# Patient Record
Sex: Female | Born: 1990 | Race: White | Marital: Single | Smoking: Never smoker
Health system: Southern US, Community
[De-identification: ages and names within clinical notes are randomized; demographics above are authoritative.]

## PROBLEM LIST (undated history)

## (undated) HISTORY — PX: TONSILLECTOMY: SUR1361

## (undated) HISTORY — PX: EYE SURGERY: SHX253

---
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
## 2013-07-03 ENCOUNTER — Encounter (HOSPITAL_COMMUNITY): Payer: Self-pay | Admitting: Emergency Medicine

## 2013-07-03 ENCOUNTER — Emergency Department (HOSPITAL_COMMUNITY)
Admission: EM | Admit: 2013-07-03 | Discharge: 2013-07-04 | Disposition: A | Discharge status: Home / Self Care | Payer: BC Managed Care – PPO | Attending: Emergency Medicine | Admitting: Emergency Medicine

## 2013-07-03 DIAGNOSIS — Z3202 Encounter for pregnancy test, result negative: Secondary | ICD-10-CM | POA: Insufficient documentation

## 2013-07-03 DIAGNOSIS — R109 Unspecified abdominal pain: Secondary | ICD-10-CM

## 2013-07-03 DIAGNOSIS — R1012 Left upper quadrant pain: Secondary | ICD-10-CM | POA: Insufficient documentation

## 2013-07-03 DIAGNOSIS — R1013 Epigastric pain: Secondary | ICD-10-CM | POA: Insufficient documentation

## 2013-07-03 LAB — BASIC METABOLIC PANEL
BUN: 17 mg/dL (ref 6–23)
CALCIUM: 9.6 mg/dL (ref 8.4–10.5)
CO2: 28 mEq/L (ref 19–32)
Chloride: 103 mEq/L (ref 96–112)
Creatinine, Ser: 1.07 mg/dL (ref 0.50–1.10)
GFR calc Af Amer: 86 mL/min — ABNORMAL LOW (ref >90)
GFR, EST NON AFRICAN AMERICAN: 74 mL/min — AB (ref >90)
GLUCOSE: 97 mg/dL (ref 70–99)
Potassium: 4.1 mEq/L (ref 3.7–5.3)
SODIUM: 142 meq/L (ref 137–147)

## 2013-07-03 LAB — CBC WITH DIFFERENTIAL/PLATELET
Basophils Absolute: 0 10*3/uL (ref 0.0–0.1)
Basophils Relative: 0 % (ref 0–1)
EOS ABS: 0.6 10*3/uL (ref 0.0–0.7)
EOS PCT: 10 % — AB (ref 0–5)
HCT: 39.4 % (ref 36.0–46.0)
Hemoglobin: 13.1 g/dL (ref 12.0–15.0)
LYMPHS ABS: 2.1 10*3/uL (ref 0.7–4.0)
Lymphocytes Relative: 36 % (ref 12–46)
MCH: 30.8 pg (ref 26.0–34.0)
MCHC: 33.2 g/dL (ref 30.0–36.0)
MCV: 92.7 fL (ref 78.0–100.0)
Monocytes Absolute: 0.4 10*3/uL (ref 0.1–1.0)
Monocytes Relative: 7 % (ref 3–12)
Neutro Abs: 2.7 10*3/uL (ref 1.7–7.7)
Neutrophils Relative %: 47 % (ref 43–77)
PLATELETS: 209 10*3/uL (ref 150–400)
RBC: 4.25 MIL/uL (ref 3.87–5.11)
RDW: 12.9 % (ref 11.5–15.5)
WBC: 5.7 10*3/uL (ref 4.0–10.5)

## 2013-07-03 LAB — URINALYSIS, ROUTINE W REFLEX MICROSCOPIC
BILIRUBIN URINE: NEGATIVE
GLUCOSE, UA: NEGATIVE mg/dL
HGB URINE DIPSTICK: NEGATIVE
KETONES UR: NEGATIVE mg/dL
LEUKOCYTES UA: NEGATIVE
Nitrite: NEGATIVE
PROTEIN: NEGATIVE mg/dL
Specific Gravity, Urine: 1.025 (ref 1.005–1.030)
Urobilinogen, UA: 0.2 mg/dL (ref 0.0–1.0)
pH: 6 (ref 5.0–8.0)

## 2013-07-03 LAB — PREGNANCY, URINE: Preg Test, Ur: NEGATIVE

## 2013-07-03 MED ORDER — FAMOTIDINE IN NACL 20-0.9 MG/50ML-% IV SOLN
20.0000 mg | Freq: Once | INTRAVENOUS | Status: AC
  Administered 2013-07-03: 20 mg via INTRAVENOUS
  Filled 2013-07-03: qty 50

## 2013-07-03 MED ORDER — HYDROCODONE-ACETAMINOPHEN 5-325 MG PO TABS
1.0000 | ORAL_TABLET | Freq: Once | ORAL | Status: AC
  Administered 2013-07-04: 1 via ORAL
  Filled 2013-07-03: qty 1

## 2013-07-03 NOTE — ED Notes (Signed)
Pt a+ox4, presents with c/o L sided abd pain x4 days, intermittent previously but constant today.  Pt reports pain radiates into mid back.  7/10.  Pt denies fevers/chills, n/v/d/c, denies dysuria.  Reports eating/drinking normally.  Skin pwd.  MAEI.  Ambulating with steady gait.

## 2013-07-04 ENCOUNTER — Ambulatory Visit: Payer: BC Managed Care – PPO

## 2013-07-04 ENCOUNTER — Ambulatory Visit (INDEPENDENT_AMBULATORY_CARE_PROVIDER_SITE_OTHER): Payer: BC Managed Care – PPO | Admitting: Internal Medicine

## 2013-07-04 ENCOUNTER — Emergency Department (HOSPITAL_COMMUNITY): Payer: BC Managed Care – PPO

## 2013-07-04 VITALS — BP 102/64 | HR 55 | Temp 97.5°F | Resp 16 | Ht 66.5 in | Wt 166.4 lb

## 2013-07-04 DIAGNOSIS — R1084 Generalized abdominal pain: Secondary | ICD-10-CM

## 2013-07-04 DIAGNOSIS — R1011 Right upper quadrant pain: Secondary | ICD-10-CM

## 2013-07-04 DIAGNOSIS — K5900 Constipation, unspecified: Secondary | ICD-10-CM

## 2013-07-04 LAB — POCT CBC
Granulocyte percent: 68.5 %G (ref 37–80)
HEMATOCRIT: 41 % (ref 37.7–47.9)
HEMOGLOBIN: 12.8 g/dL (ref 12.2–16.2)
Lymph, poc: 1.5 (ref 0.6–3.4)
MCH, POC: 30.6 pg (ref 27–31.2)
MCHC: 31.2 g/dL — AB (ref 31.8–35.4)
MCV: 98.1 fL — AB (ref 80–97)
MID (cbc): 0.5 (ref 0–0.9)
MPV: 8.8 fL (ref 0–99.8)
POC GRANULOCYTE: 4.3 (ref 2–6.9)
POC LYMPH PERCENT: 23.8 %L (ref 10–50)
POC MID %: 7.7 % (ref 0–12)
Platelet Count, POC: 182 10*3/uL (ref 142–424)
RBC: 4.18 M/uL (ref 4.04–5.48)
RDW, POC: 13.9 %
WBC: 6.3 10*3/uL (ref 4.6–10.2)

## 2013-07-04 LAB — COMPLETE METABOLIC PANEL WITH GFR
ALBUMIN: 4.3 g/dL (ref 3.5–5.2)
ALT: 10 U/L (ref 0–35)
AST: 15 U/L (ref 0–37)
Alkaline Phosphatase: 37 U/L — ABNORMAL LOW (ref 39–117)
BUN: 12 mg/dL (ref 6–23)
CO2: 27 meq/L (ref 19–32)
Calcium: 8.8 mg/dL (ref 8.4–10.5)
Chloride: 104 mEq/L (ref 96–112)
Creat: 0.89 mg/dL (ref 0.50–1.10)
GFR, Est Non African American: >89 mL/min
Glucose, Bld: 90 mg/dL (ref 70–99)
POTASSIUM: 4.5 meq/L (ref 3.5–5.3)
Sodium: 137 mEq/L (ref 135–145)
Total Bilirubin: 0.4 mg/dL (ref 0.2–1.2)
Total Protein: 6.5 g/dL (ref 6.0–8.3)

## 2013-07-04 LAB — HEPATIC FUNCTION PANEL
ALBUMIN: 4.1 g/dL (ref 3.5–5.2)
ALT: 11 U/L (ref 0–35)
AST: 20 U/L (ref 0–37)
Alkaline Phosphatase: 52 U/L (ref 39–117)
Bilirubin, Direct: <0.2 mg/dL (ref 0.0–0.3)
TOTAL PROTEIN: 7.3 g/dL (ref 6.0–8.3)
Total Bilirubin: 0.3 mg/dL (ref 0.3–1.2)

## 2013-07-04 LAB — LIPASE, BLOOD: Lipase: 32 U/L (ref 11–59)

## 2013-07-04 MED ORDER — POLYETHYLENE GLYCOL 3350 17 GM/SCOOP PO POWD: 17.0000 g | Freq: Two times a day (BID) | ORAL | Status: DC | PRN

## 2013-07-04 MED ORDER — OMEPRAZOLE 20 MG PO CPDR: 20.0000 mg | DELAYED_RELEASE_CAPSULE | Freq: Every day | ORAL | Status: DC

## 2013-07-04 MED ORDER — IBUPROFEN 600 MG PO TABS: 600.0000 mg | ORAL_TABLET | Freq: Four times a day (QID) | ORAL | Status: DC | PRN

## 2013-07-04 NOTE — Progress Notes (Signed)
Subjective:    Patient ID: Natalie Davidson, female    DOB: Nov 02, 1990, 23 y.o.   MRN: [id number]  HPI Pt presents to clinic with RUQ pain that started 5 days ago and it has become more frequent and intense since it started.  She went to the ED last pm and today she feels worse.  The pain seems worse today than yesterday and today it seems more on the right side compared with yesterday where it hurt more on the left side.  She ate some almond butter and that seemed to start her pain this afternoon - this am she ate cereal and did not have any pain.  She has no nausea associated with the pain.  She has not had problems with constipation or diarrhea.  No recent GI illness.  She has not changed her eating habits.  She drinks a lot of coffee and diet sodas.  She does not drink a lot of milk.  She has no problems with heartburn or indigestion.  She was told last night her labs were normal in the ED as was an [place] of her gallbladder and pancreas.  She felt feverish today in class but she does not feel sick.  The pain is between a cramping and a sharp stabbing - earlier today it was intense for about 1.5 hours and since then it has improved but not resolved.  She has never had anything like this before.  She has had no sick contacts.  She drinks rare ETOH.  Review of Systems  Constitutional: Positive for chills. Negative for fever.  Gastrointestinal: Positive for abdominal pain. Negative for nausea, vomiting, diarrhea, constipation, blood in stool and abdominal distention.  Genitourinary: Negative.  Negative for dysuria, vaginal discharge and vaginal pain.       Objective:   Physical Exam  Vitals reviewed. Constitutional: She is oriented to person, place, and time. She appears well-developed and well-nourished.  HENT:  Head: Normocephalic and atraumatic.  Right Ear: External ear normal.  Left Ear: External ear normal.  Cardiovascular: Normal rate, regular rhythm and normal heart sounds.   No murmur  heard. Pulmonary/Chest: Effort normal and breath sounds normal.  Abdominal: Soft. There is no hepatosplenomegaly. There is tenderness in the right upper quadrant and epigastric area. There is positive Murphy's sign (mild positive). There is no guarding and no CVA tenderness.  Neurological: She is alert and oriented to person, place, and time.  Skin: Skin is warm and dry.  Psychiatric: She has a normal mood and affect. Her behavior is normal. Judgment and thought content normal.     Reviewed labs from ED visit last pm with normal lipase, CBC and LFTs.  [facility] reading (PRIMARY) by  Dr. Merla Riches.  Stool burden - otherwise normal.  Results for orders placed in visit on 07/04/13  POCT CBC      Result Value Range   WBC 6.3  4.6 - 10.2 K/uL   Lymph, poc 1.5  0.6 - 3.4   POC LYMPH PERCENT 23.8  10 - 50 %L   MID (cbc) 0.5  0 - 0.9   POC MID % 7.7  0 - 12 %M   POC Granulocyte 4.3  2 - 6.9   Granulocyte percent 68.5  37 - 80 %G   RBC 4.18  4.04 - 5.48 M/uL   Hemoglobin 12.8  12.2 - 16.2 g/dL   HCT, POC 40.9  81.1 - 47.9 %   MCV 98.1 (*) 80 - 97 fL  MCH, POC 30.6  27 - 31.2 pg   MCHC 31.2 (*) 31.8 - 35.4 g/dL   RDW, POC 40.913.9     Platelet Count, POC 182  142 - 424 K/uL   MPV 8.8  0 - 99.8 fL       Assessment & Plan:  RUQ pain - Plan: COMPLETE METABOLIC PANEL WITH GFR, POCT CBC, DG Abd Acute W/Chest, polyethylene glycol powder (GLYCOLAX/MIRALAX) powder  Unspecified constipation - Plan: polyethylene glycol powder (GLYCOLAX/MIRALAX) powder  With her xrays - she most likely is suffering from constipation and we will treat with semi-aggressive treatment of miralax.  She will makes sure she stays hydrated while she is using the miralax to help the osmotic function of the medication.  She will be aware of greasy/fatty foods until she is pain free but I do not think with a normal gallbladder US that is her problem.  [facility].  Benny LennertSarah Edilson Vital PA-C  07/04/2013 6:57 PM  Agree with evaluation by physician assistant Valarie ConesWeber Harrel Lemonobert P. Sandria Balesoolittle M.D.

## 2013-07-04 NOTE — ED Notes (Signed)
Patient visitor approached this nurse at the desk and stated "The ultrasound girl was supposed to come back in 30 minutes and that was an hour ago." This issue with d/w US tech, who was asked to leave this room by [facility] for an emergent patient exam This information was relayed to both the patient and pt's visitor--both agree and v/u Patient resting in a position of comfort and appears in NAD--no N/V/D or discomfort endorsed Call bell in reach

## 2013-07-04 NOTE — ED Notes (Signed)
US testing now completed Patient remains in NAD

## 2013-07-04 NOTE — Discharge Instructions (Signed)
We saw you in the ER for the abdominal pain. All of our results are normal, including all labs and imaging. Kidney function is fine as well. We are not sure what is causing your abdominal pain, and recommend that you see your primary care doctor within 2-3 days for further evaluation. If your symptoms get worse, return to the ER. Take the pain meds and nausea meds as prescribed.   Abdominal Pain, Women Abdominal (stomach, pelvic, or belly) pain can be caused by many things. It is important to tell your doctor:  The location of the pain.  Does it come and go or is it present all the time?  Are there things that start the pain (eating certain foods, exercise)?  Are there other symptoms associated with the pain (fever, nausea, vomiting, diarrhea)? All of this is helpful to know when trying to find the cause of the pain. CAUSES   Stomach: virus or bacteria infection, or ulcer.  Intestine: appendicitis (inflamed appendix), regional ileitis (Crohn's disease), ulcerative colitis (inflamed colon), irritable bowel syndrome, diverticulitis (inflamed diverticulum of the colon), or cancer of the stomach or intestine.  Gallbladder disease or stones in the gallbladder.  Kidney disease, kidney stones, or infection.  Pancreas infection or cancer.  Fibromyalgia (pain disorder).  Diseases of the female organs:  Uterus: fibroid (non-cancerous) tumors or infection.  Fallopian tubes: infection or tubal pregnancy.  Ovary: cysts or tumors.  Pelvic adhesions (scar tissue).  Endometriosis (uterus lining tissue growing in the pelvis and on the pelvic organs).  Pelvic congestion syndrome (female organs filling up with blood just before the menstrual period).  Pain with the menstrual period.  Pain with ovulation (producing an egg).  Pain with an IUD (intrauterine device, birth control) in the uterus.  Cancer of the female organs.  Functional pain (pain not caused by a disease, may improve  without treatment).  Psychological pain.  Depression. DIAGNOSIS  Your doctor will decide the seriousness of your pain by doing an examination.  Blood tests.  X-rays.  Ultrasound.  CT scan (computed tomography, special type of X-ray).  MRI (magnetic resonance imaging).  Cultures, for infection.  Barium enema (dye inserted in the large intestine, to better view it with X-rays).  Colonoscopy (looking in intestine with a lighted tube).  Laparoscopy (minor surgery, looking in abdomen with a lighted tube).  Major abdominal exploratory surgery (looking in abdomen with a large incision). TREATMENT  The treatment will depend on the cause of the pain.   Many cases can be observed and treated at home.  Over-the-counter medicines recommended by your caregiver.  Prescription medicine.  Antibiotics, for infection.  Birth control pills, for painful periods or for ovulation pain.  Hormone treatment, for endometriosis.  Nerve blocking injections.  Physical therapy.  Antidepressants.  Counseling with a psychologist or psychiatrist.  Minor or major surgery. HOME CARE INSTRUCTIONS   Do not take laxatives, unless directed by your caregiver.  Take over-the-counter pain medicine only if ordered by your caregiver. Do not take aspirin because it can cause an upset stomach or bleeding.  Try a clear liquid diet (broth or water) as ordered by your caregiver. Slowly move to a bland diet, as tolerated, if the pain is related to the stomach or intestine.  Have a thermometer and take your temperature several times a day, and record it.  Bed rest and sleep, if it helps the pain.  Avoid sexual intercourse, if it causes pain.  Avoid stressful situations.  Keep your follow-up appointments and  tests, as your caregiver orders.  If the pain does not go away with medicine or surgery, you may try:  Acupuncture.  Relaxation exercises (yoga, meditation).  Group  therapy.  Counseling. SEEK MEDICAL CARE IF:   You notice certain foods cause stomach pain.  Your home care treatment is not helping your pain.  You need stronger pain medicine.  You want your IUD removed.  You feel faint or lightheaded.  You develop nausea and vomiting.  You develop a rash.  You are having side effects or an allergy to your medicine. SEEK IMMEDIATE MEDICAL CARE IF:   Your pain does not go away or gets worse.  You have a fever.  Your pain is felt only in portions of the abdomen. The right side could possibly be appendicitis. The left lower portion of the abdomen could be colitis or diverticulitis.  You are passing blood in your stools (bright red or black tarry stools, with or without vomiting).  You have blood in your urine.  You develop chills, with or without a fever.  You pass out. MAKE SURE YOU:   Understand these instructions.  Will watch your condition.  Will get help right away if you are not doing well or get worse. Document Released: 03/16/2007 Document Revised: 08/11/2011 Document Reviewed: 04/05/2009 [facility] Patient Information 2014 [place] [place].  Pain of Unknown Etiology (Pain Without a Known Cause) You have come to your caregiver because of pain. Pain can occur in any part of the body. Often there is not a definite cause. If your laboratory (blood or urine) work was normal and X-rays or other studies were normal, your caregiver may treat you without knowing the cause of the pain. An example of this is the headache. Most headaches are diagnosed by taking a history. This means your caregiver asks you questions about your headaches. Your caregiver determines a treatment based on your answers. Usually testing done for headaches is normal. Often testing is not done unless there is no response to medications. Regardless of where your pain is located today, you can be given medications to make you comfortable. If no physical cause of pain can  be found, most cases of pain will gradually leave as suddenly as they came.  If you have a painful condition and no reason can be found for the pain, it is important that you follow up with your caregiver. If the pain becomes worse or does not go away, it may be necessary to repeat tests and look further for a possible cause.  Only take over-the-counter or prescription medicines for pain, discomfort, or fever as directed by your caregiver.  For the protection of your privacy, test results cannot be given over the phone. Make sure you receive the results of your test. Ask how these results are to be obtained if you have not been informed. It is your responsibility to obtain your test results.  You may continue all activities unless the activities cause more pain. When the pain lessens, it is important to gradually resume normal activities. Resume activities by beginning slowly and gradually increasing the intensity and duration of the activities or exercise. During periods of severe pain, bed rest may be helpful. Lie or sit in any position that is comfortable.  Ice used for acute (sudden) conditions may be effective. Use a large plastic bag filled with ice and wrapped in a towel. This may provide pain relief.  See your caregiver for continued problems. Your caregiver can help or refer you  for exercises or physical therapy if necessary. If you were given medications for your condition, do not drive, operate machinery or power tools, or sign legal documents for 24 hours. Do not drink alcohol, take sleeping pills, or take other medications that may interfere with treatment. See your caregiver immediately if you have pain that is becoming worse and not relieved by medications. Document Released: 02/11/2001 Document Revised: 03/09/2013 Document Reviewed: 05/19/2005 [facility] Patient Information 2014 [place], [place].

## 2013-07-04 NOTE — Patient Instructions (Signed)
2/2 - mix 2 capfuls in 10-12 oz of fluid tonight 2/3 - am 1 capful in 8 oz, pm 1 capful in 8 oz Continue 2x.day until liquid stools-- then do 1 capful in 8 oz of fluid daily for 7 days and then 1 capful in 8oz of fluid every other day for a week to allow intestines to reset.

## 2013-07-04 NOTE — ED Provider Notes (Addendum)
CSN: [id number]     Arrival date & time 07/03/13  2205 History   First MD Initiated Contact with Patient 07/03/13 2300     Chief Complaint  Patient presents with  . Abdominal Pain   (Consider location/radiation/quality/duration/timing/severity/associated sxs/prior Treatment) HPI Comments: Pt comes in with cc of left sided abd pain. She has no medical hx. Pain x 4 days, intermittent initially, but now constant since this afternoon. The pain is epigastric and LUQ and radiating to the back. Pain is 7/10. No n/v/f/c/diarrhea/ abd surgeries. Pt denies any uti like sx, vag discharge/ bleeding. PO intake has been normal Pt is on her period right now.  Patient is a 23 y.o. female presenting with abdominal pain. The history is provided by the patient.  Abdominal Pain Associated symptoms: no chest pain, no constipation, no cough, no diarrhea, no dysuria, no fever, no hematuria, no nausea, no shortness of breath, no vaginal bleeding, no vaginal discharge and no vomiting     History reviewed. No pertinent past medical history. History reviewed. No pertinent past surgical history. No family history on file. History  Substance Use Topics  . Smoking status: Never Smoker   . Smokeless tobacco: Never Used  . Alcohol Use: 0.6 oz/week    1 Glasses of wine per week   OB History   Grav Para Term Preterm Abortions TAB SAB Ect Mult Living                 Review of Systems  Constitutional: Negative for fever and activity change.  HENT: Negative for facial swelling.   Respiratory: Negative for cough, shortness of breath and wheezing.   Cardiovascular: Negative for chest pain.  Gastrointestinal: Positive for abdominal pain. Negative for nausea, vomiting, diarrhea, constipation, blood in stool and abdominal distention.  Genitourinary: Negative for dysuria, hematuria, vaginal bleeding, vaginal discharge and difficulty urinating.  Musculoskeletal: Negative for neck pain.  Skin: Negative for color change.   Neurological: Negative for speech difficulty.  Hematological: Does not bruise/bleed easily.  Psychiatric/Behavioral: Negative for confusion.    Allergies  Vantin  Home Medications   Current Outpatient Rx  Name  Route  Sig  Dispense  Refill  . ibuprofen (ADVIL,MOTRIN) 200 MG tablet   Oral   Take 400 mg by mouth every 6 (six) hours as needed (pain).         . norgestimate-ethinyl estradiol (ORTHO-CYCLEN,SPRINTEC,PREVIFEM) 0.25-35 MG-MCG tablet   Oral   Take 1 tablet by mouth daily.          BP 115/71  Pulse 70  Temp(Src) 97.8 F (36.6 C) (Oral)  Resp 16  Ht 5\' 7"  (1.702 m)  Wt 165 lb (74.844 kg)  BMI 25.84 kg/m2  SpO2 98%  LMP 07/03/2013 Physical Exam  Nursing note and vitals reviewed. Constitutional: She is oriented to person, place, and time. She appears well-developed and well-nourished.  HENT:  Head: Normocephalic and atraumatic.  Eyes: EOM are normal. Pupils are equal, round, and reactive to light.  Neck: Neck supple.  Cardiovascular: Normal rate, regular rhythm and normal heart sounds.   No murmur heard. Pulmonary/Chest: Effort normal. No respiratory distress.  Abdominal: Soft. She exhibits no distension. There is tenderness. There is no rebound and no guarding.  Epigastric and LUQ tenderness. No flank tenderness.  Neurological: She is alert and oriented to person, place, and time.  Skin: Skin is warm and dry.    ED Course  Procedures (including critical care time) Labs Review Labs Reviewed  CBC WITH DIFFERENTIAL -  Abnormal; Notable for the following:    Eosinophils Relative 10 (*)    All other components within normal limits  BASIC METABOLIC PANEL - Abnormal; Notable for the following:    GFR calc non Af Amer 74 (*)    GFR calc Af Amer 86 (*)    All other components within normal limits  PREGNANCY, URINE  URINALYSIS, ROUTINE W REFLEX MICROSCOPIC  HEPATIC FUNCTION PANEL  LIPASE, BLOOD   Imaging Review No results found.  EKG Interpretation    None       MDM  No diagnosis found.  Pt comes in with cc of abd pain.  DDx includes: Pancreatitis Hepatobiliary pathology including cholecystitis Gastritis/PUD Intra abdominal abscess Thrombosis Nephrolithiasis Pyelonephritis UTI/Cystitis Ovarian cyst TOA Ectopic pregnancy PID STD  Pt's abd pain is epigastric and left sided - radiating to the back. No peritoneal findings. Pulses are equal.  Unsure what the etiology is at this time. [place] abd ordered. Basic labs are all normal.  Pt has no lower quadrant tenderness. I don't see any utility in pelvic exam because of above. She also denies vaginal d/c.    Derwood Kaplan, MD 07/04/13 1610  Derwood Kaplan, MD 07/04/13 [id number]

## 2013-07-06 ENCOUNTER — Telehealth: Payer: Self-pay | Admitting: *Deleted

## 2013-07-06 ENCOUNTER — Telehealth: Payer: Self-pay

## 2013-07-06 NOTE — Telephone Encounter (Signed)
She can increase the amount of miralax that she is taking or give it a few more days.

## 2013-07-06 NOTE — Telephone Encounter (Signed)
Patient would like to speak with Benny Lennertsarah weber about her last visit [phone]

## 2013-07-06 NOTE — Telephone Encounter (Signed)
Pt states the Miralax has not really given any relief. Is this normal? What should she try  next? Has been taking two capfuls

## 2013-07-06 NOTE — Telephone Encounter (Signed)
lmom to cb. 

## 2013-07-06 NOTE — Telephone Encounter (Signed)
[place] and stated that pt is in a lot of pain and wanted some explaination of her last visit.  Advised her of notes and test results and that she should increase miralax and mother feels like that miralax will not help her.  I advised her that she should come in.  She will call pt and tell her to come on in.

## 2013-07-06 NOTE — Telephone Encounter (Signed)
Spoke with mother.  Answered moms questions.  She can try MOM and mag citrate to help with her constipation.  She can increase her Miralax to 2 capful bid and we can get more aggressive if that is something that the patient desires.  Please call the patient and let her know that I talked with her mom and a recheck is ok if she is worse, it is also ok to increase to 2-3 capfuls tonight or we can do a colonoscopy prep if she would like to do that.  Please let me know that the patient desires.

## 2013-07-12 ENCOUNTER — Ambulatory Visit: Payer: BC Managed Care – PPO

## 2013-07-12 ENCOUNTER — Ambulatory Visit (INDEPENDENT_AMBULATORY_CARE_PROVIDER_SITE_OTHER): Payer: BC Managed Care – PPO | Admitting: Family Medicine

## 2013-07-12 VITALS — BP 100/58 | HR 59 | Temp 97.6°F | Resp 16 | Ht 66.5 in | Wt 163.6 lb

## 2013-07-12 DIAGNOSIS — K5900 Constipation, unspecified: Secondary | ICD-10-CM

## 2013-07-12 DIAGNOSIS — R109 Unspecified abdominal pain: Secondary | ICD-10-CM

## 2013-07-12 MED ORDER — PEG 3350-KCL-NABCB-NACL-NASULF 236 G PO SOLR: 4000.0000 mL | Freq: Once | ORAL | Status: DC

## 2013-07-12 NOTE — Patient Instructions (Signed)
8oz every 15 mins

## 2013-07-12 NOTE — Progress Notes (Signed)
   Subjective:    Patient ID: Natalie FlowSarah A Irving, female    DOB: [birth year], 23 y.o.   MRN: [id number]  HPI Pt presents to clinic with for continued abd pain when eating sometimes.  The pain is less intense and less often but she is still having.  When she has the pain it is after she eats but she can sometimes eat but not have pain.  She is drinking normal.  She has been using Miralax 2 capfuls daily and she has had liquid stools since then.  She has had no fevers and no nausea or vomiting.  Pt is not currently having abd pain but she did have a episode last pm.  Review of Systems  Constitutional: Negative for fever and chills.  Gastrointestinal: Positive for abdominal pain. Negative for nausea.       Objective:   Physical Exam  Vitals reviewed. Constitutional: She is oriented to person, place, and time. She appears well-developed and well-nourished.  HENT:  Head: Normocephalic and atraumatic.  Right Ear: External ear normal.  Left Ear: External ear normal.  Eyes: Conjunctivae are normal.  Cardiovascular: Normal rate, regular rhythm and normal heart sounds.   No murmur heard. Pulmonary/Chest: Effort normal and breath sounds normal. She has no wheezes.  Abdominal: Soft. There is no tenderness.  Neurological: She is alert and oriented to person, place, and time.  Skin: Skin is warm and dry.  Psychiatric: She has a normal mood and affect. Her behavior is normal. Judgment and thought content normal.   [facility] reading (PRIMARY) by  Dr. Katrinka BlazingSmith.  Significant stool burden not significantly changed from 2/2.      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  Benny LennertSarah Cathern Tahir PA-C 07/12/2013 12:43 PM

## 2013-11-03 ENCOUNTER — Other Ambulatory Visit: Payer: Self-pay | Admitting: Physician Assistant

## 2013-11-03 NOTE — Telephone Encounter (Signed)
Patient states she is out of her medication and needs a refill. [facility] pharmacy sent [place] the request today but hasn't been processed yet.

## 2013-11-04 NOTE — Telephone Encounter (Signed)
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

## 2013-11-05 ENCOUNTER — Telehealth: Payer: Self-pay

## 2013-11-05 NOTE — Telephone Encounter (Signed)
Patient's mother called and states that her daughter is waiting on a refill for birth control pills. States that the [facility] in [place] needs prior authorization. Pharmacy phone number is [phone].  Please call patient's mother Natalie Davidson at [phone]

## 2013-11-05 NOTE — Telephone Encounter (Signed)
Patient's mother called and states that her daughter is waiting on a refill for birth control pills. States that the [facility] in [place] needs prior authorization. Pharmacy phone number is [phone]. ° °Please call patient's mother Susan at [phone] °

## 2013-11-06 NOTE — Telephone Encounter (Signed)
Who has been writing for this patient's birth control? I do not see that we have ever written for this in epic? Is this a Engineer, drilling that Majesti sees?

## 2013-11-06 NOTE — Telephone Encounter (Signed)
Mother called again about this.  They have been requesting this since Thursday.  Says pharmacy sent request too.  Please call asap. [phone]

## 2013-11-07 ENCOUNTER — Other Ambulatory Visit: Payer: Self-pay | Admitting: Physician Assistant

## 2013-11-07 ENCOUNTER — Telehealth: Payer: Self-pay

## 2013-11-07 NOTE — Telephone Encounter (Signed)
[name] advised that this is a Consulting civil engineer she sees at [employer]. She OKd RFs on fax and I faxed back to pharm. Notified mother on VM done.

## 2013-11-07 NOTE — Telephone Encounter (Signed)
pts mother states that someone called her today and left a voicemail stating that pts rx for birth control wazs faxed back to the pharmacy, she states that the pharmacy has no record of this. Please advise. Best# [phone]

## 2013-11-08 NOTE — Telephone Encounter (Signed)
Called pharmacist and gave him RFs x 3 per Maleiah's instrs to last pt until she sees her again in the fall. Notified mother.

## 2014-01-29 ENCOUNTER — Other Ambulatory Visit: Payer: Self-pay | Admitting: Physician Assistant

## 2014-01-31 NOTE — Telephone Encounter (Signed)
Tylee is this one of your college pts?

## 2014-04-03 NOTE — Progress Notes (Signed)
History and physical examinations reviewed with Benny LennertSarah Weber, PA-C.  AAS reviewed. Agree with assessment and plan.

## 2014-04-25 IMAGING — CR DG ABDOMEN ACUTE W/ 1V CHEST
3 series · 3 of 3 positions shown · non-contrast
Comparison: US ABDOMEN COMPLETE dated 07/04/2013

CLINICAL DATA: Right upper quadrant discomfort

EXAM:
ACUTE ABDOMEN SERIES (ABDOMEN 2 VIEW & CHEST 1 VIEW)

[PA]
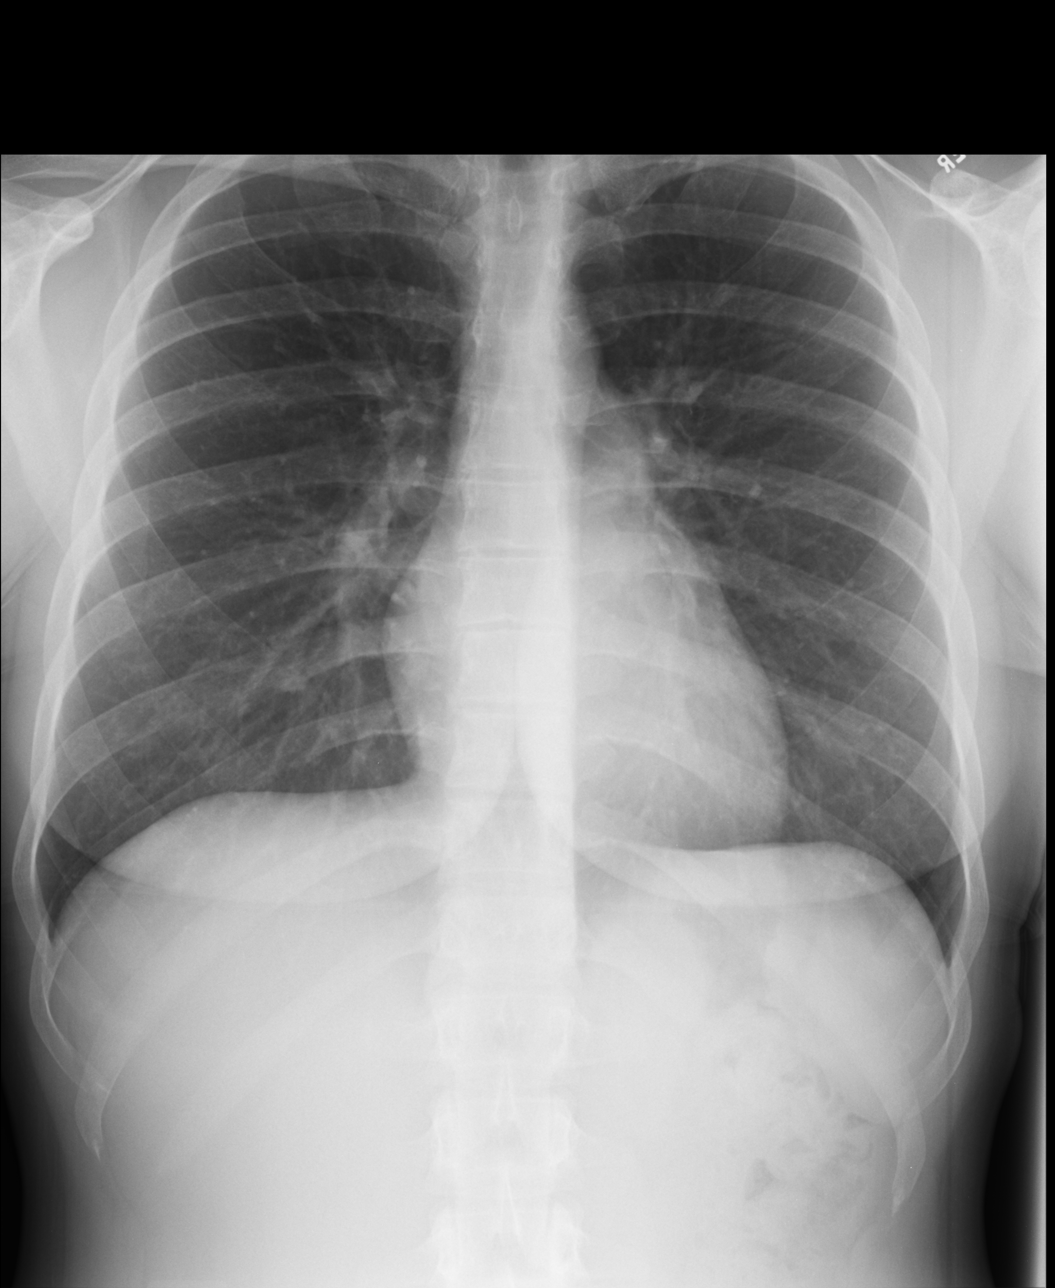

[AP (1 of 2)]
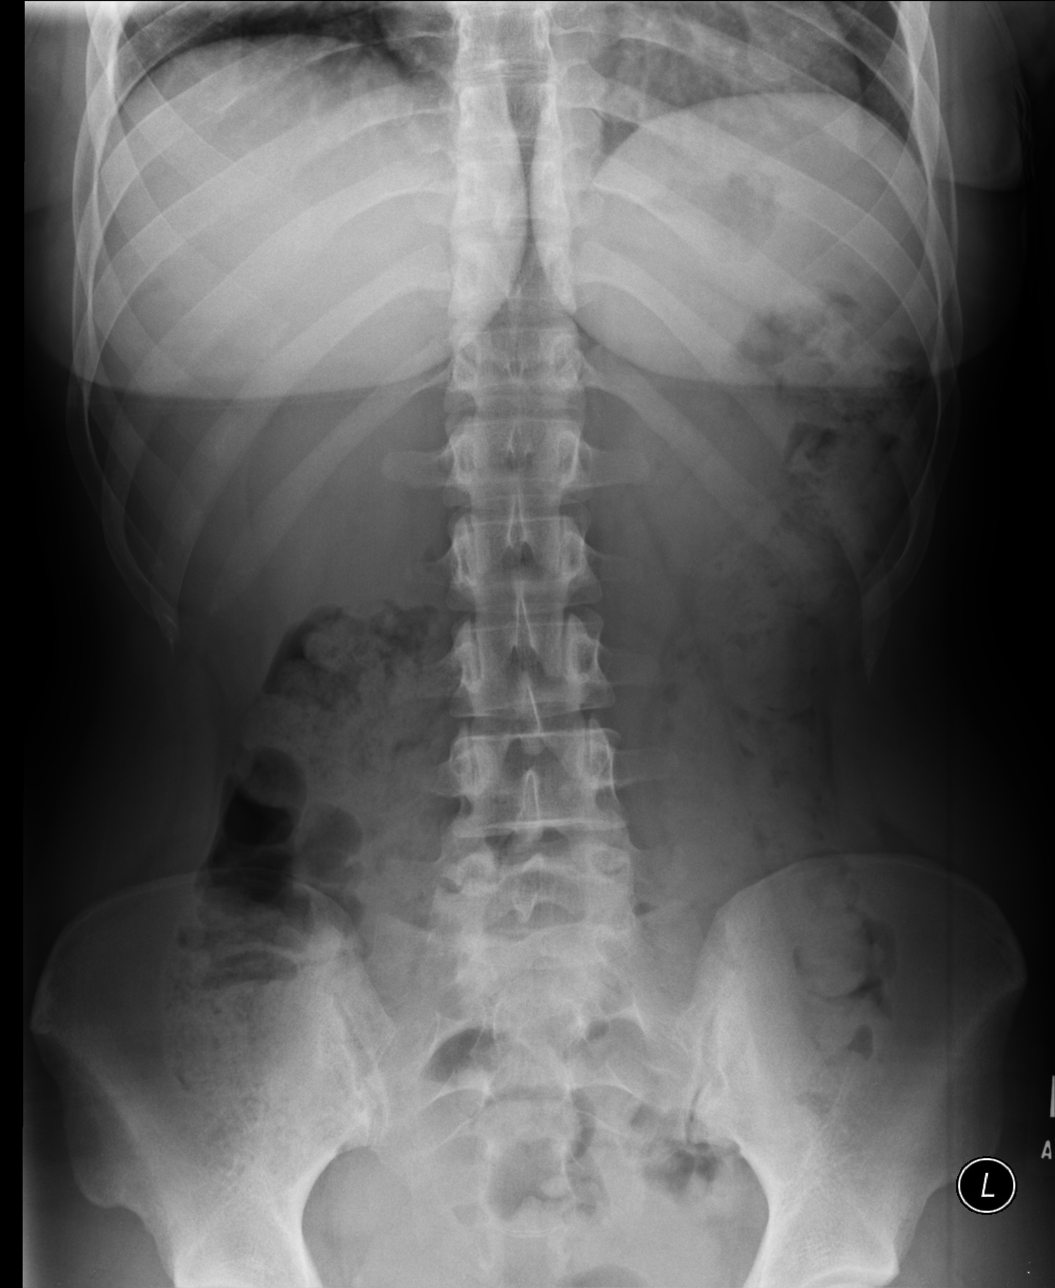

[AP (2 of 2)]
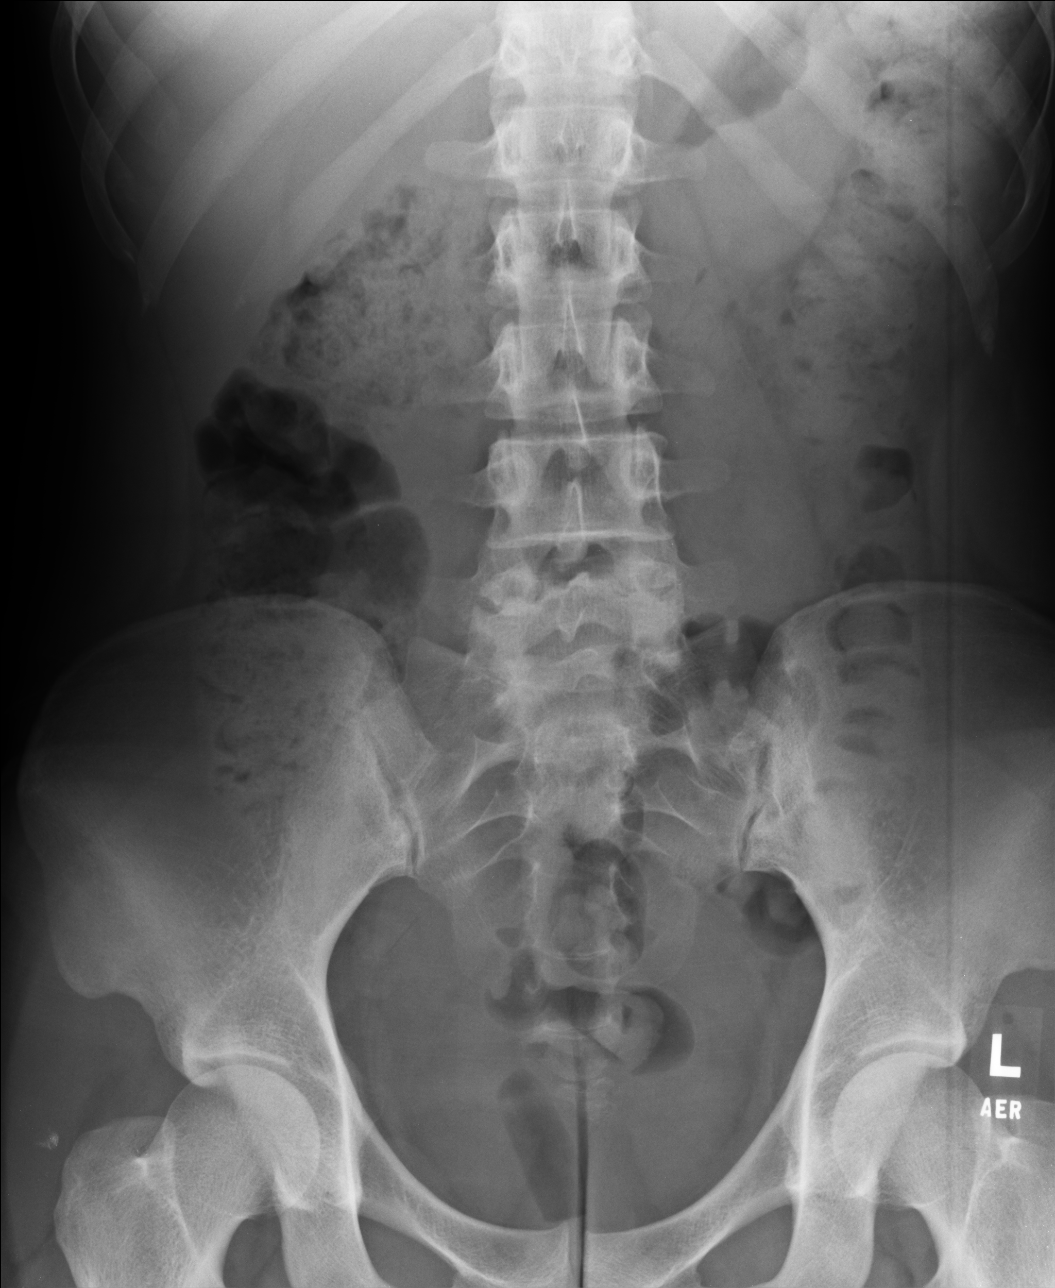

[3 of 3 positions shown; findings below may reference images not displayed]

FINDINGS: The chest film reveals the lungs to be well-expanded and clear. The
heart and mediastinal structures are normal. There is no pleural
effusion. No subdiaphragmatic gas collections are demonstrated.
Within the abdomen increased stool burden is noted throughout the
colon. There is no evidence of a small or large bowel obstruction.
No abnormal soft tissue calcifications are demonstrated. The bony
structures appear normal.
IMPRESSION: 1. There is increased stool burden throughout the colon.
2. There is no evidence of active cardiopulmonary disease.

## 2014-05-03 IMAGING — CR DG ABDOMEN ACUTE W/ 1V CHEST
3 series · 3 of 3 positions shown · non-contrast
Comparison: 07/04/2013

CLINICAL DATA: Abdominal pain.  Constipation.

EXAM:
ACUTE ABDOMEN SERIES (ABDOMEN 2 VIEW & CHEST 1 VIEW)

[PA]
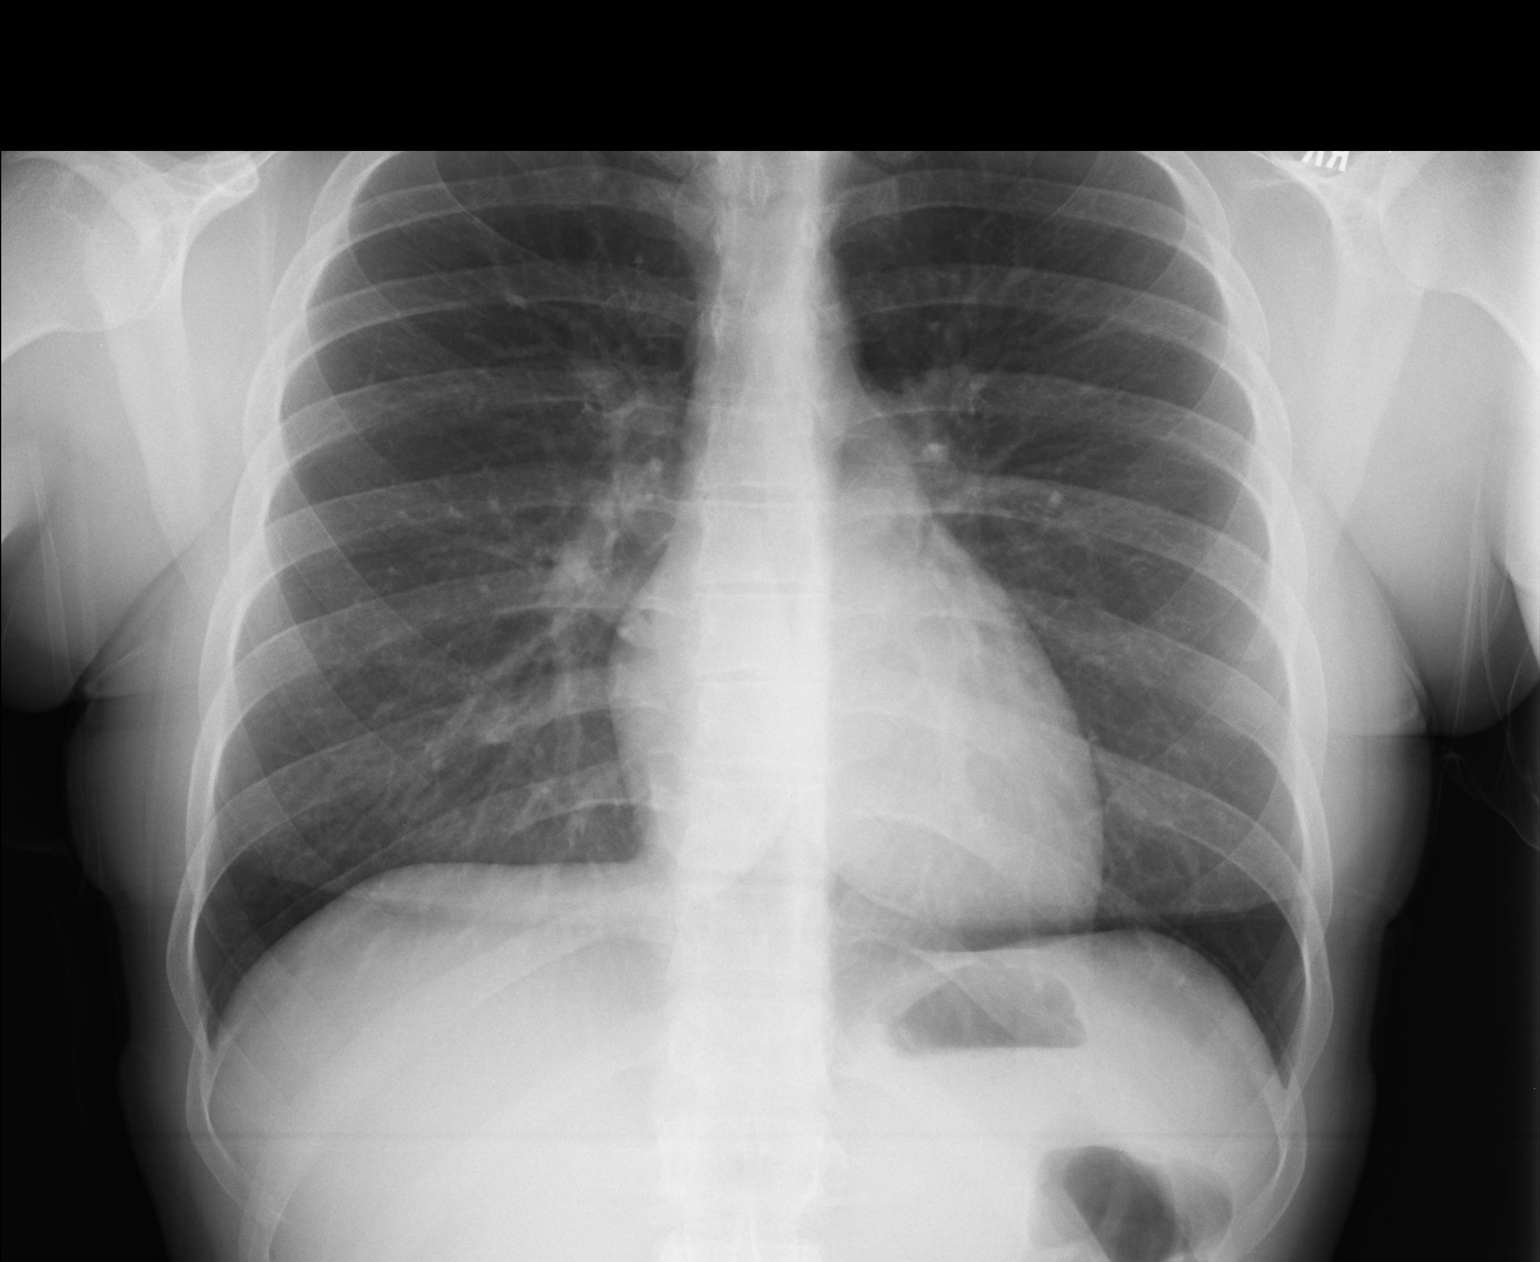

[AP (1 of 2)]
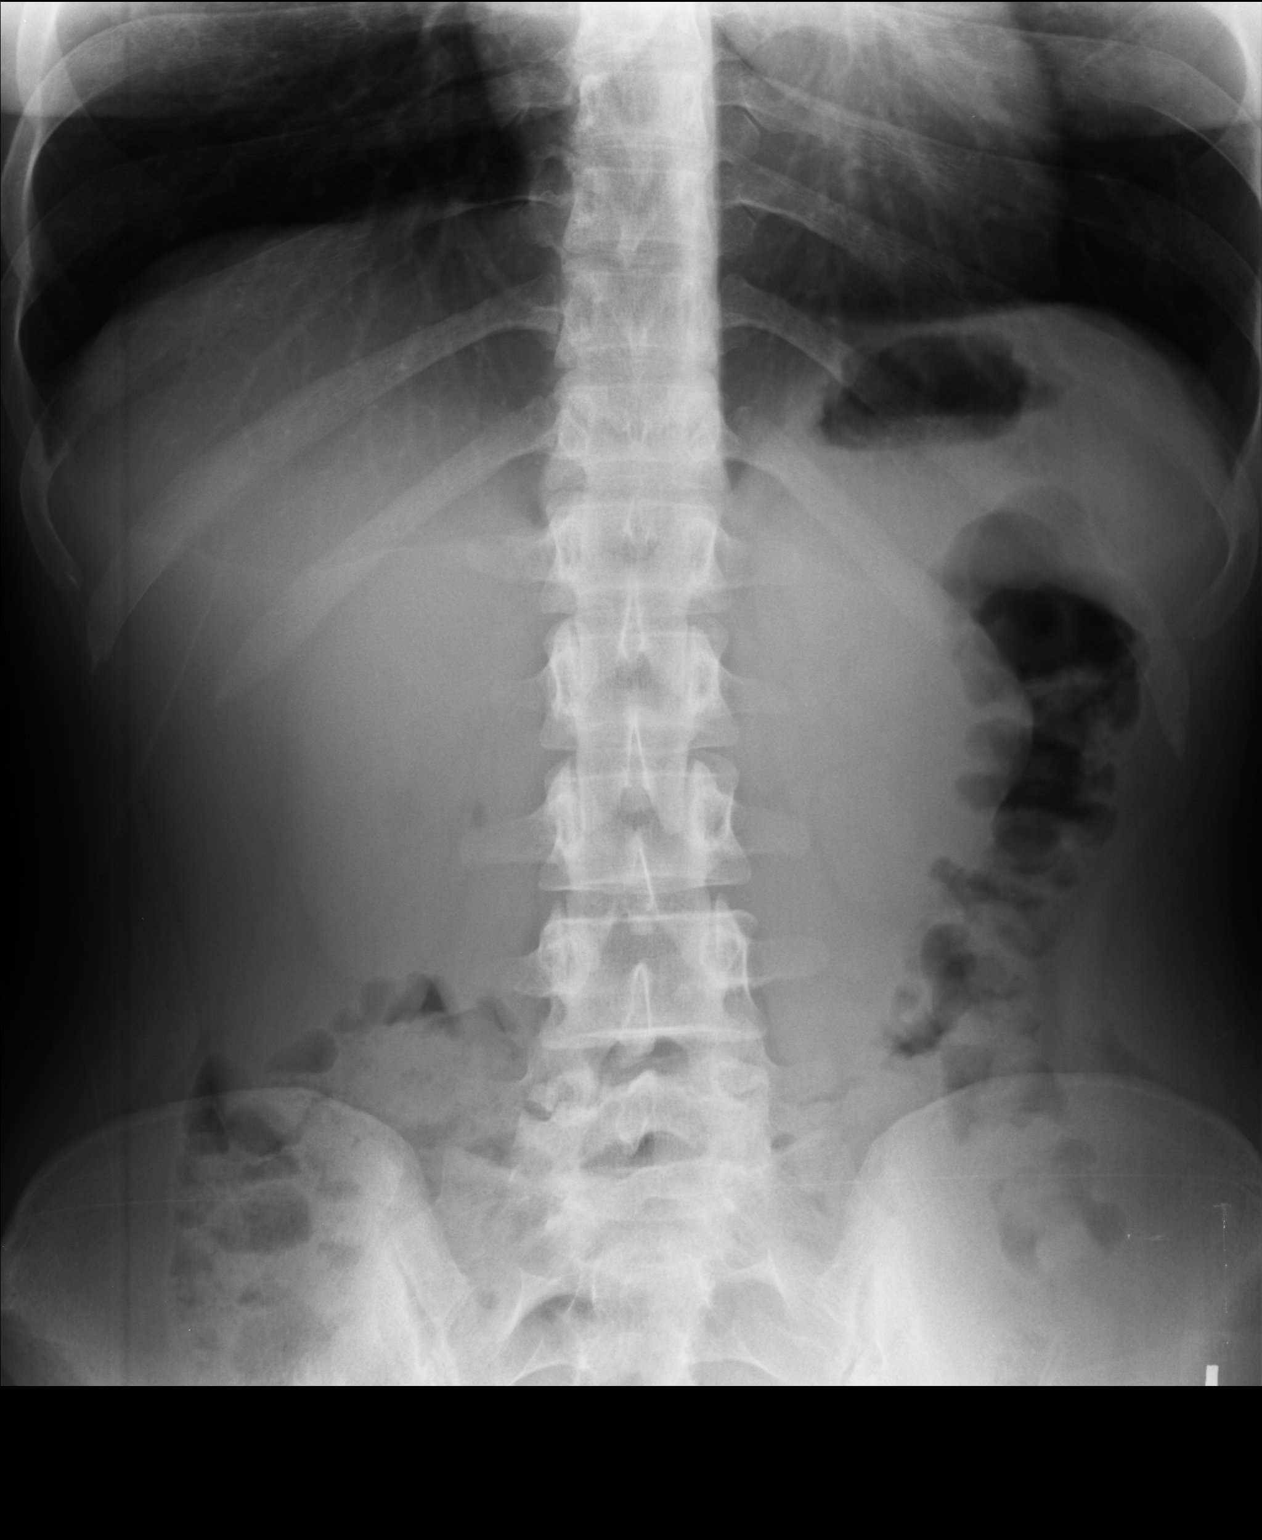

[AP (2 of 2)]
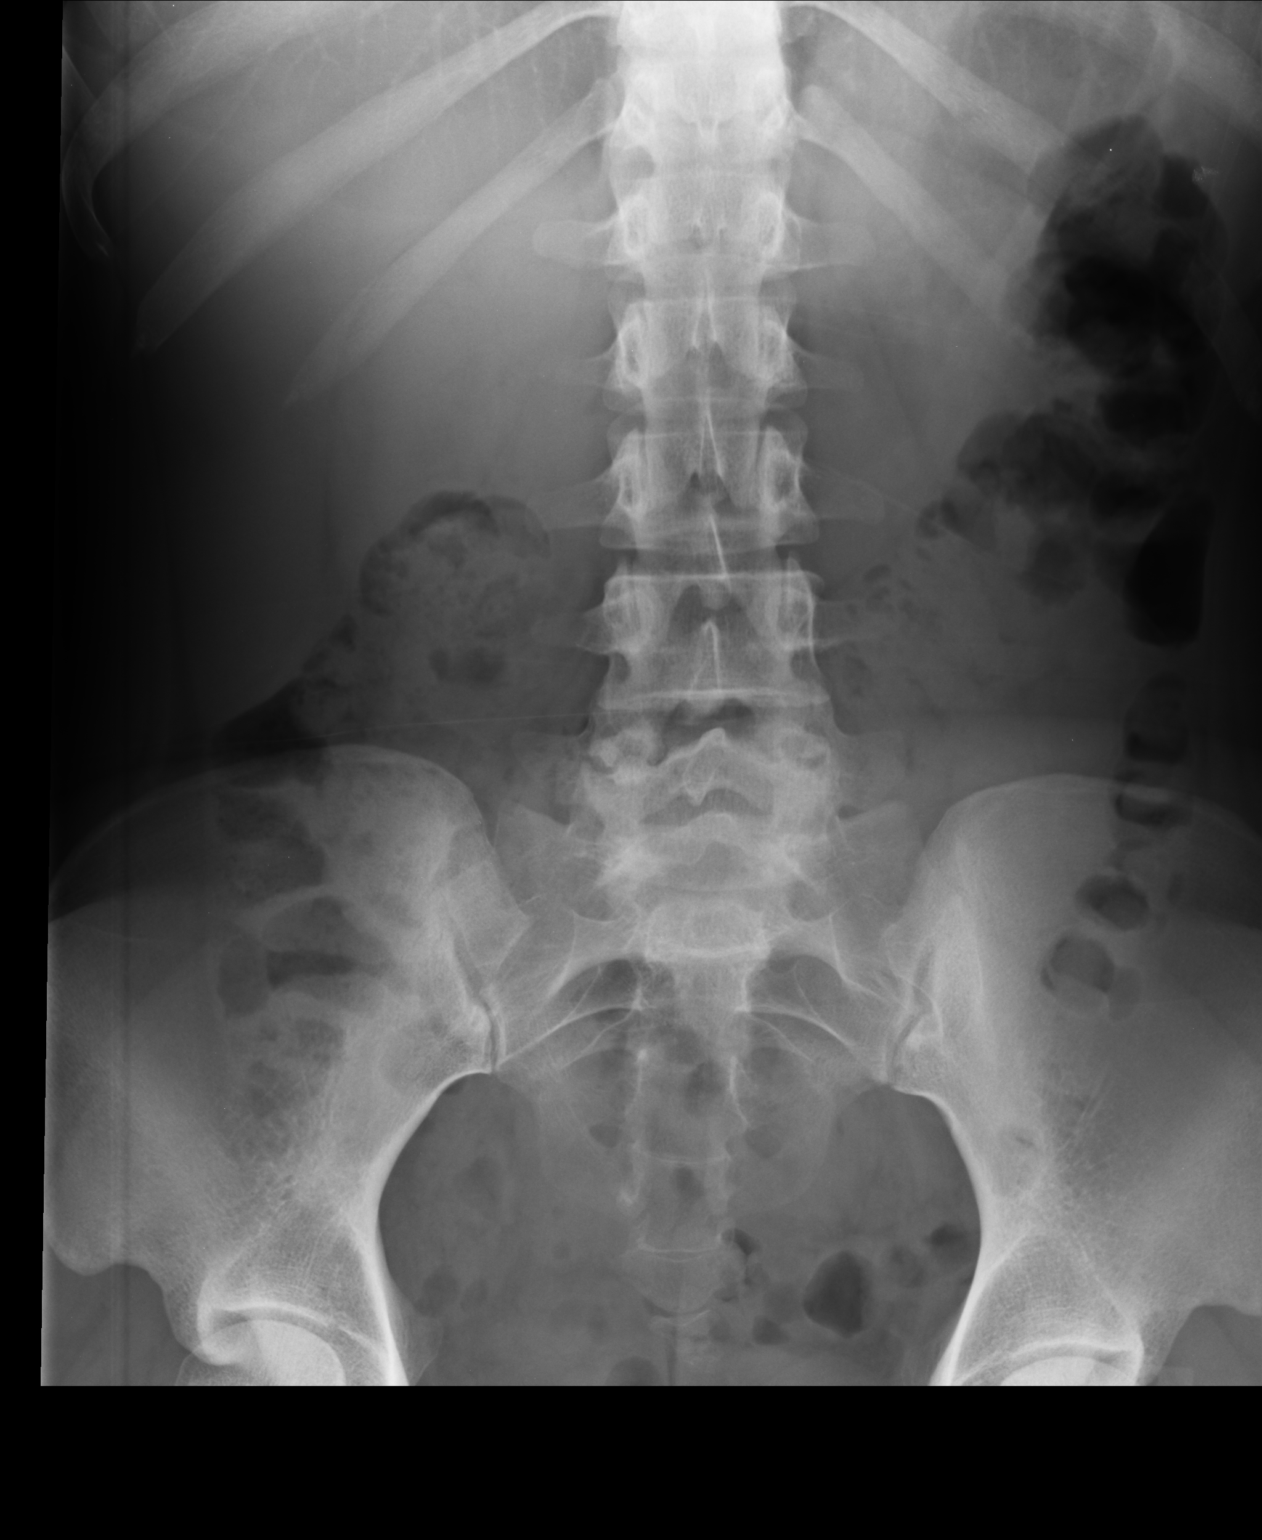

[3 of 3 positions shown; findings below may reference images not displayed]

FINDINGS: Stool burden is decreased when compared the prior study, now normal
in degree.

No bowel dilation. No evidence of obstruction or generalized
adynamic ileus. No free air. Normal abdominal soft tissues.

Normal frontal chest radiograph.

No bony abnormality.
IMPRESSION: Negative abdominal radiographs.  No acute cardiopulmonary disease.

## 2014-12-12 ENCOUNTER — Ambulatory Visit (INDEPENDENT_AMBULATORY_CARE_PROVIDER_SITE_OTHER): Payer: BLUE CROSS/BLUE SHIELD | Admitting: Family Medicine

## 2014-12-12 VITALS — BP 132/68 | HR 83 | Temp 98.5°F | Resp 15 | Ht 66.5 in | Wt 179.0 lb

## 2014-12-12 DIAGNOSIS — N898 Other specified noninflammatory disorders of vagina: Secondary | ICD-10-CM | POA: Diagnosis not present

## 2014-12-12 LAB — POCT WET PREP WITH KOH
CLUE CELLS WET PREP PER HPF POC: NEGATIVE
KOH PREP POC: NEGATIVE
TRICHOMONAS UA: NEGATIVE
YEAST WET PREP PER HPF POC: NEGATIVE

## 2014-12-12 NOTE — Progress Notes (Signed)
   Subjective:    Patient ID: Natalie Davidson, female    DOB: 1990-12-12, 24 y.o.   MRN: [id number]  HPI Patient presents today with 2 days of small amount thin, malodorous vaginal discharge. 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 Wears cotton underwear.  She is sexually active and takes OCPs daily. She is not interested in screening for STI today.  No past medical history on file. Past Surgical History  Procedure Laterality Date  . Eye surgery    . Tonsillectomy     No family history on file. History  Substance Use Topics  . Smoking status: Never Smoker   . Smokeless tobacco: Never Used  . Alcohol Use: 0.6 oz/week    1 Glasses of wine per week  Medications, allergies, past medical history, surgical history, family history, social history and problem list reviewed and updated.  Review of Systems No fever/chills, no hematuria/dysuria/frequency. No vaginal itching. No abdominal pain, no nausea, no vomiting.     Objective:   Physical Exam  Constitutional: She is oriented to person, place, and time. She appears well-developed and well-nourished. No distress.  HENT:  Head: Normocephalic.  Neck: Normal range of motion.  Cardiovascular: Normal rate, regular rhythm and normal heart sounds.   Pulmonary/Chest: Effort normal and breath sounds normal.  Genitourinary: Pelvic exam was performed with patient supine.  Perineum shaved. Scattered erythematous papules on buttocks. Small amount thin discharge.   Musculoskeletal: Normal range of motion.  Neurological: She is alert and oriented to person, place, and time.  Skin: Skin is warm and dry. She is not diaphoretic.  Psychiatric: She has a normal mood and affect. Her behavior is normal. Thought content normal.  Vitals reviewed.   BP 132/68 mmHg  Pulse 83  Temp(Src) 98.5 F (36.9 C) (Oral)  Resp 15  Ht 5' 6.5" (1.689 m)  Wt 179 lb (81.194 kg)  BMI 28.46 kg/m2  SpO2 99%  LMP 12/01/2014 Results for orders placed or  performed in visit on 12/12/14  POCT Wet Prep with KOH  Result Value Ref Range   Trichomonas, UA Negative    Clue Cells Wet Prep HPF POC neg    Epithelial Wet Prep HPF POC Few Few, Moderate, Many   Yeast Wet Prep HPF POC neg    Bacteria Wet Prep HPF POC None (A) Few   RBC Wet Prep HPF POC 0-4    WBC Wet Prep HPF POC 2-4    KOH Prep POC Negative       Assessment & Plan:  1. Vaginal discharge - POCT Wet Prep with KOH- negative - Odor likely related to wearing tight shorts and sweating while working outside, suggested she change her underwear frequently, wear loose bottoms whenever possible and wash daily with mild soap and water, drying thoroughly.  - Follow up PRN  Olean Reeeborah Anida Deol, FNP-BC  [facility]  12/14/2014 10:15 AM

## 2014-12-19 NOTE — Progress Notes (Signed)
  Medical screening examination/treatment/procedure(s) were performed by non-physician practitioner and as supervising physician I was immediately available for consultation/collaboration.
